# Patient Record
Sex: Female | Born: 1940 | Race: White | Hispanic: No | Marital: Married | Smoking: Never smoker
Health system: Southern US, Community
[De-identification: ages and names within clinical notes are randomized; demographics above are authoritative.]

## PROBLEM LIST (undated history)

## (undated) DIAGNOSIS — E039 Hypothyroidism, unspecified: Secondary | ICD-10-CM

## (undated) DIAGNOSIS — Z972 Presence of dental prosthetic device (complete) (partial): Secondary | ICD-10-CM

## (undated) DIAGNOSIS — I10 Essential (primary) hypertension: Secondary | ICD-10-CM

## (undated) DIAGNOSIS — H3530 Unspecified macular degeneration: Secondary | ICD-10-CM

## (undated) DIAGNOSIS — K5790 Diverticulosis of intestine, part unspecified, without perforation or abscess without bleeding: Secondary | ICD-10-CM

## (undated) DIAGNOSIS — E785 Hyperlipidemia, unspecified: Secondary | ICD-10-CM

## (undated) DIAGNOSIS — K219 Gastro-esophageal reflux disease without esophagitis: Secondary | ICD-10-CM

## (undated) HISTORY — PX: COLONOSCOPY: SHX174

## (undated) HISTORY — PX: ESOPHAGOGASTRODUODENOSCOPY: SHX1529

---

## 2004-05-09 ENCOUNTER — Ambulatory Visit: Payer: Self-pay | Admitting: Family Medicine

## 2005-07-23 ENCOUNTER — Ambulatory Visit: Payer: Self-pay | Admitting: Family Medicine

## 2006-07-22 ENCOUNTER — Ambulatory Visit: Payer: Self-pay | Admitting: Family Medicine

## 2007-08-01 ENCOUNTER — Ambulatory Visit: Payer: Self-pay | Admitting: Family Medicine

## 2007-08-14 ENCOUNTER — Ambulatory Visit: Payer: Self-pay | Admitting: Internal Medicine

## 2007-08-24 ENCOUNTER — Ambulatory Visit: Payer: Self-pay | Admitting: Family Medicine

## 2007-08-29 ENCOUNTER — Ambulatory Visit: Payer: Self-pay | Admitting: Family Medicine

## 2007-09-05 ENCOUNTER — Ambulatory Visit: Payer: Self-pay | Admitting: Internal Medicine

## 2007-09-11 ENCOUNTER — Ambulatory Visit: Payer: Self-pay | Admitting: Internal Medicine

## 2007-10-12 ENCOUNTER — Ambulatory Visit: Payer: Self-pay | Admitting: Internal Medicine

## 2008-07-19 IMAGING — CT CT CHEST-ABD-PELV W/ CM
1 of 2 series · 15 of 31 positions shown, 19 images · non-contrast
Comparison: none

REASON FOR EXAM: Shortness of breath, splenomegaly, staging of lymphoma
COMMENTS:

[Series 2: soft tissue · axial · 0.74mm/px · z∈[-592,-48]mm · 15 of 123 slices shown, 19 images]
[im 7/123  mediastinal]
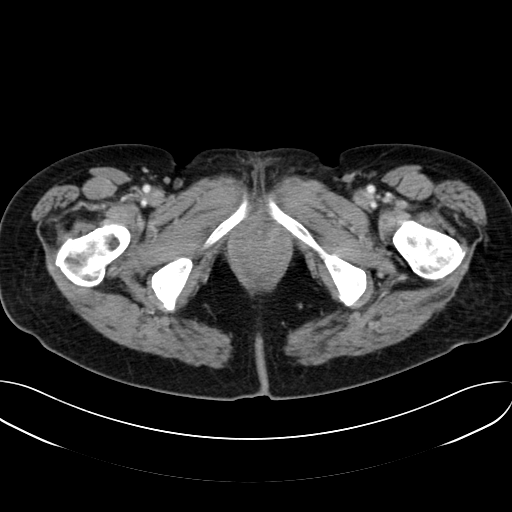
[im 7/123  bone]
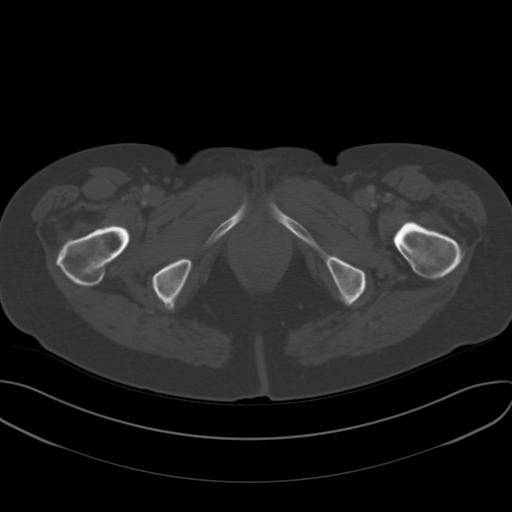
[im 20/123  mediastinal]
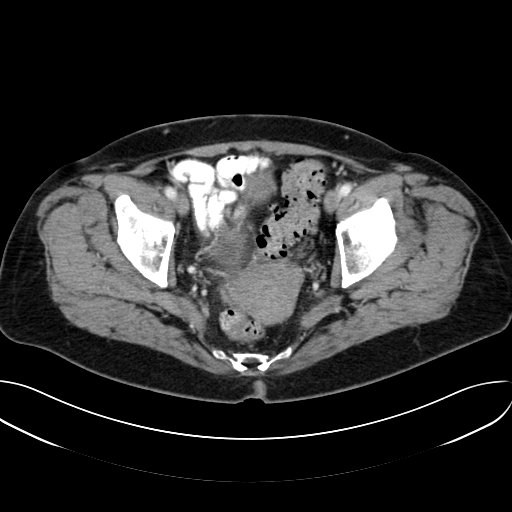
[im 33/123  mediastinal]
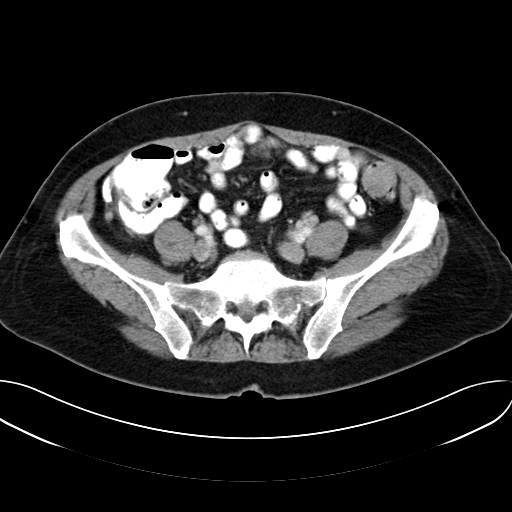
[im 39/123  mediastinal]
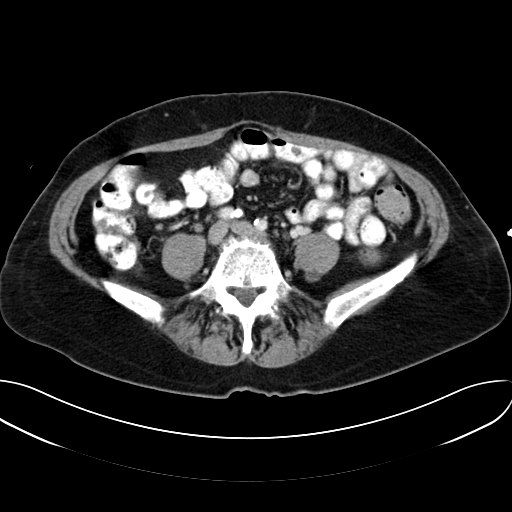
[im 45/123  mediastinal]
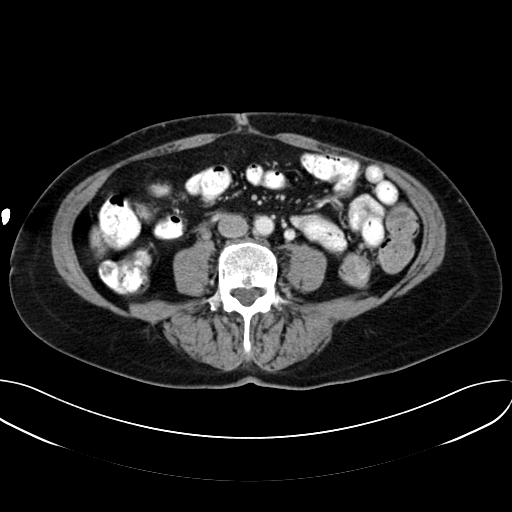
[im 52/123  mediastinal]
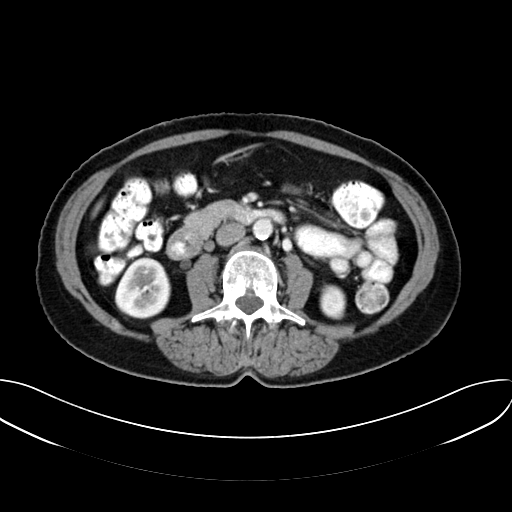
[im 60/123  mediastinal]
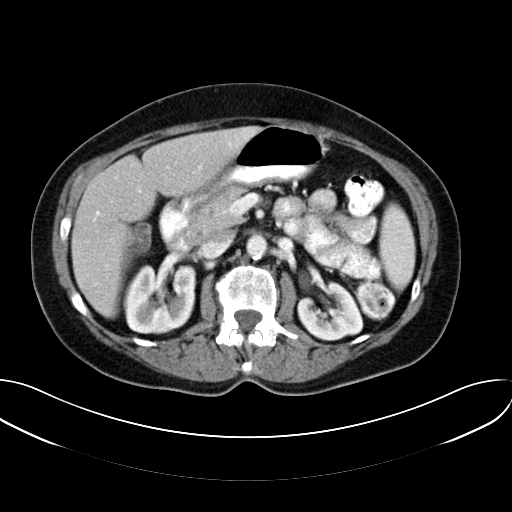
[im 71/123  mediastinal]
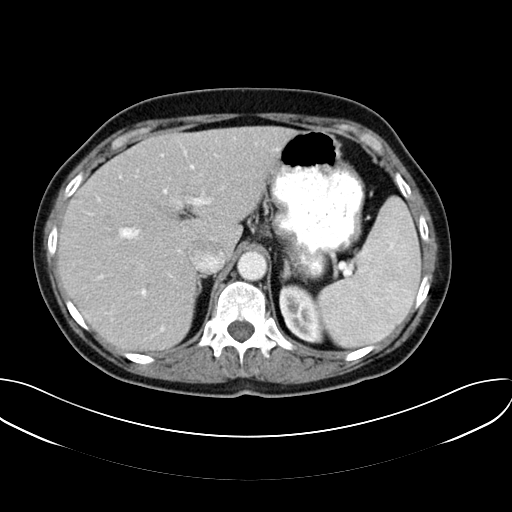
[im 78/123  mediastinal]
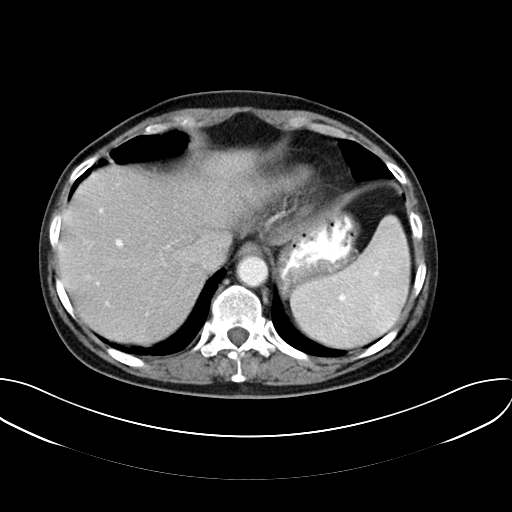
[im 78/123  bone]
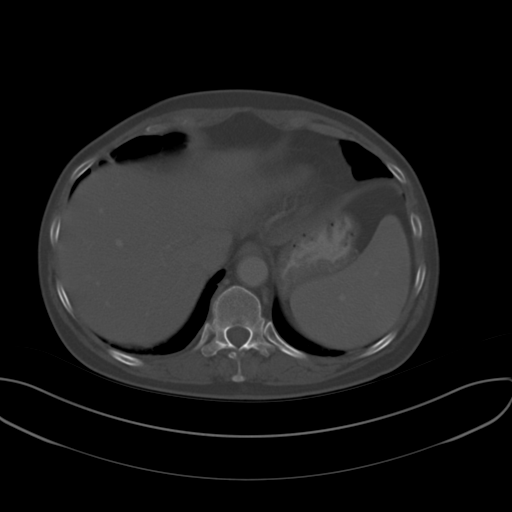
[im 84/123  mediastinal]
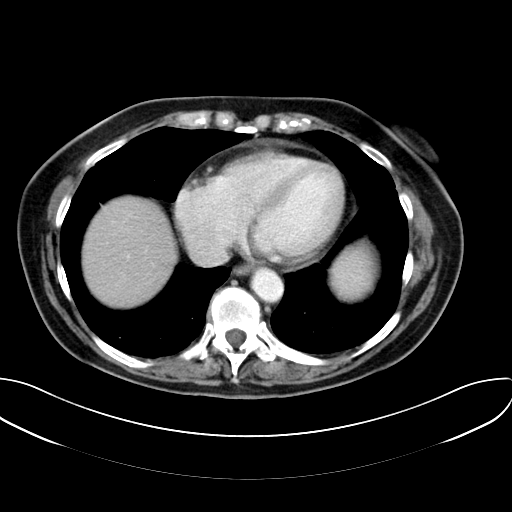
[im 90/123  mediastinal]
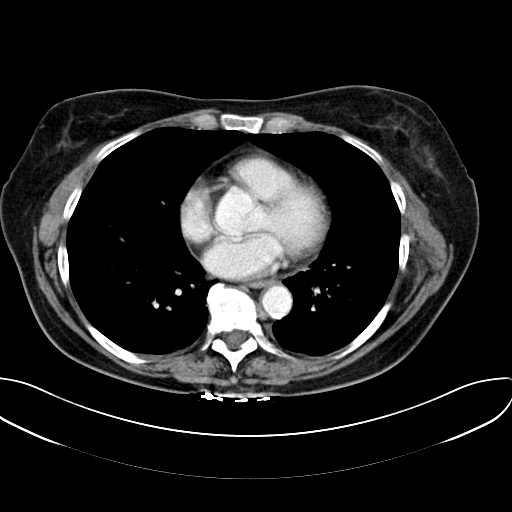
[im 97/123  lung]
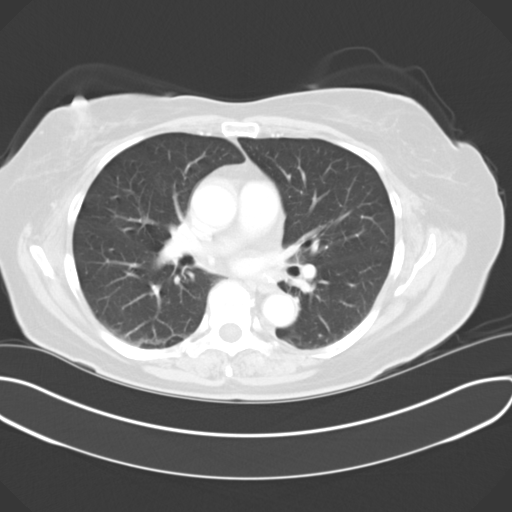
[im 103/123  mediastinal]
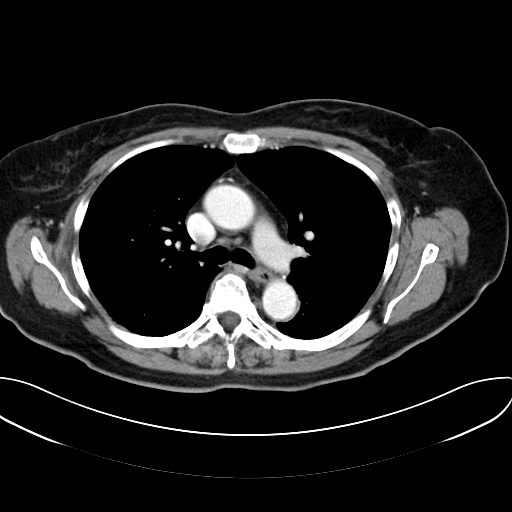
[im 103/123  lung]
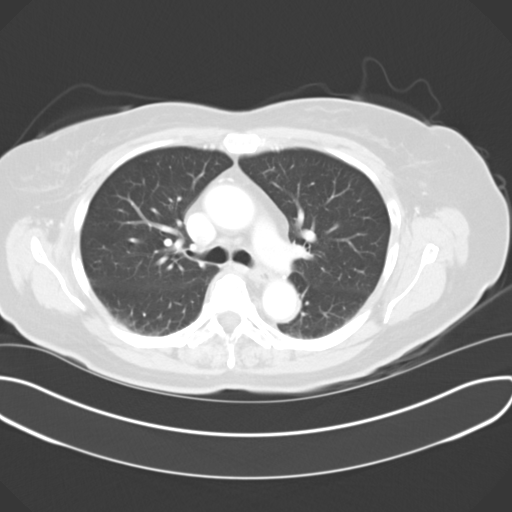
[im 110/123  lung]
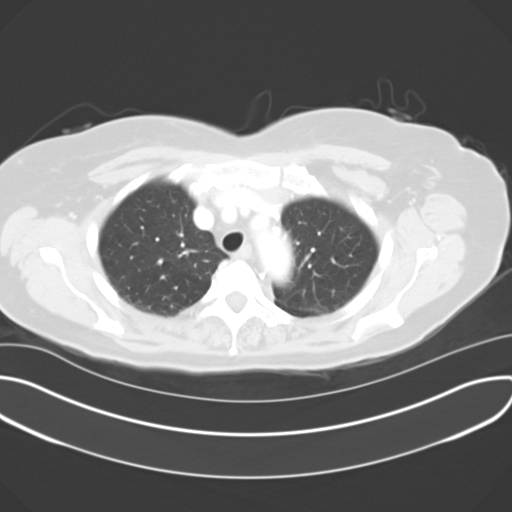
[im 116/123  mediastinal]
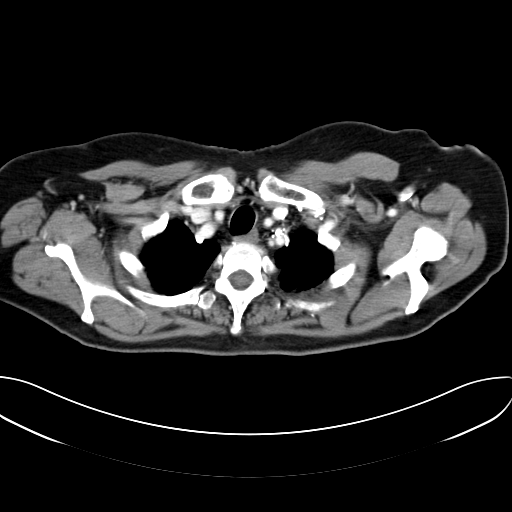
[im 116/123  lung]
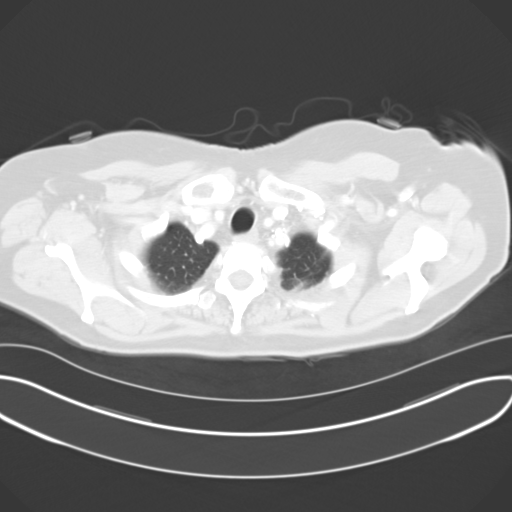

[15 of 31 positions shown; findings below may reference images not displayed]

PROCEDURE:     CT  - CT CHEST ABDOMEN AND PELVIS W  - September 26, 2007  [DATE]

RESULT:     Helical, 5.0 mm sections were obtained from the thoracic inlet
through the lung bases status post intravenous administration of 85 ml of
Msovue-IQU.

The study is compared to a previous study dated 08/29/2007.

Evaluation of the mediastinum and hilar regions and structures demonstrates
no evidence of mediastinal or hilar adenopathy or masses. The lung
parenchyma demonstrates no evidence of focal infiltrates, effusions or
edema, masses or nodules. There is no evidence of abdominal aortic aneurysm,
bowel obstruction or enteritis. There is evidence of diverticulosis within
the sigmoid colon without CT evidence of diverticulitis. There is no
evidence of pelvic loculated fluid collections or significant free fluid.
The spleen measures 12.3 cm in longitudinal dimension. There does not appear
to be further evidence of abdominal or pelvic free fluid, loculated fluid
collections, masses or adenopathy.
IMPRESSION: The spleen is within normal limits as described above
which, when compared to the previous study, has decreased in size. Otherwise
no further abdominal or pelvic abnormalities are appreciated.

## 2008-08-21 ENCOUNTER — Ambulatory Visit: Payer: Self-pay | Admitting: Family Medicine

## 2009-05-22 ENCOUNTER — Ambulatory Visit: Payer: Self-pay | Admitting: Unknown Physician Specialty

## 2009-08-26 ENCOUNTER — Ambulatory Visit: Payer: Self-pay | Admitting: Family Medicine

## 2009-09-02 ENCOUNTER — Ambulatory Visit: Payer: Self-pay | Admitting: Family Medicine

## 2009-09-13 ENCOUNTER — Ambulatory Visit: Payer: Self-pay | Admitting: Surgery

## 2009-09-13 ENCOUNTER — Ambulatory Visit: Payer: Self-pay | Admitting: Cardiovascular Disease

## 2009-09-19 ENCOUNTER — Ambulatory Visit: Payer: Self-pay | Admitting: Surgery

## 2009-09-29 HISTORY — PX: BREAST BIOPSY: SHX20

## 2010-08-28 ENCOUNTER — Ambulatory Visit: Payer: Self-pay | Admitting: Family Medicine

## 2011-09-01 ENCOUNTER — Ambulatory Visit: Payer: Self-pay | Admitting: Family Medicine

## 2012-06-23 ENCOUNTER — Encounter: Payer: Self-pay | Admitting: Otolaryngology

## 2012-07-13 ENCOUNTER — Encounter: Payer: Self-pay | Admitting: Otolaryngology

## 2012-08-13 ENCOUNTER — Encounter: Payer: Self-pay | Admitting: Otolaryngology

## 2012-09-13 ENCOUNTER — Ambulatory Visit: Payer: Self-pay | Admitting: Family Medicine

## 2013-09-14 ENCOUNTER — Ambulatory Visit: Payer: Self-pay | Admitting: Family Medicine

## 2014-07-16 ENCOUNTER — Ambulatory Visit: Payer: Self-pay | Admitting: Unknown Physician Specialty

## 2014-09-25 ENCOUNTER — Ambulatory Visit: Payer: Self-pay | Admitting: Family Medicine

## 2014-11-05 LAB — SURGICAL PATHOLOGY

## 2015-10-17 ENCOUNTER — Other Ambulatory Visit: Payer: Self-pay | Admitting: Nurse Practitioner

## 2015-10-17 DIAGNOSIS — Z1231 Encounter for screening mammogram for malignant neoplasm of breast: Secondary | ICD-10-CM

## 2015-10-21 ENCOUNTER — Ambulatory Visit
Admission: RE | Admit: 2015-10-21 | Discharge: 2015-10-21 | Disposition: A | Discharge status: Home / Self Care | Payer: Medicare Other | Source: Ambulatory Visit | Attending: Nurse Practitioner | Admitting: Nurse Practitioner

## 2015-10-21 DIAGNOSIS — Z1231 Encounter for screening mammogram for malignant neoplasm of breast: Secondary | ICD-10-CM | POA: Diagnosis not present

## 2016-09-18 ENCOUNTER — Other Ambulatory Visit: Payer: Self-pay | Admitting: Nurse Practitioner

## 2016-09-18 DIAGNOSIS — Z1231 Encounter for screening mammogram for malignant neoplasm of breast: Secondary | ICD-10-CM

## 2016-10-21 ENCOUNTER — Ambulatory Visit
Admission: RE | Admit: 2016-10-21 | Discharge: 2016-10-21 | Disposition: A | Discharge status: Home / Self Care | Payer: Medicare Other | Source: Ambulatory Visit | Attending: Nurse Practitioner | Admitting: Nurse Practitioner

## 2016-10-21 DIAGNOSIS — Z1231 Encounter for screening mammogram for malignant neoplasm of breast: Secondary | ICD-10-CM | POA: Diagnosis present

## 2018-04-25 ENCOUNTER — Other Ambulatory Visit: Payer: Self-pay | Admitting: Nurse Practitioner

## 2018-10-07 ENCOUNTER — Other Ambulatory Visit: Payer: Self-pay | Admitting: Nurse Practitioner

## 2018-10-07 DIAGNOSIS — Z1231 Encounter for screening mammogram for malignant neoplasm of breast: Secondary | ICD-10-CM

## 2019-09-21 ENCOUNTER — Other Ambulatory Visit
Admission: RE | Admit: 2019-09-21 | Discharge: 2019-09-21 | Disposition: A | Discharge status: Home / Self Care | Payer: Medicare Other | Source: Ambulatory Visit | Attending: Internal Medicine | Admitting: Internal Medicine

## 2019-09-21 ENCOUNTER — Other Ambulatory Visit: Payer: Self-pay

## 2019-09-21 DIAGNOSIS — Z01812 Encounter for preprocedural laboratory examination: Secondary | ICD-10-CM | POA: Insufficient documentation

## 2019-09-21 DIAGNOSIS — Z20822 Contact with and (suspected) exposure to covid-19: Secondary | ICD-10-CM | POA: Insufficient documentation

## 2019-09-21 LAB — SARS CORONAVIRUS 2 (TAT 6-24 HRS): SARS Coronavirus 2: NEGATIVE

## 2019-09-22 ENCOUNTER — Encounter: Payer: Self-pay | Admitting: Internal Medicine

## 2019-09-25 ENCOUNTER — Encounter: Payer: Self-pay | Admitting: Internal Medicine

## 2019-09-25 ENCOUNTER — Ambulatory Visit: Payer: Medicare Other | Admitting: Anesthesiology

## 2019-09-25 ENCOUNTER — Ambulatory Visit
Admission: RE | Admit: 2019-09-25 | Discharge: 2019-09-25 | Disposition: A | Discharge status: Home / Self Care | Payer: Medicare Other | Attending: Internal Medicine | Admitting: Internal Medicine

## 2019-09-25 ENCOUNTER — Other Ambulatory Visit: Payer: Self-pay

## 2019-09-25 ENCOUNTER — Encounter
Admission: RE | Disposition: A | Discharge status: Home / Self Care | Payer: Self-pay | Source: Home / Self Care | Attending: Internal Medicine

## 2019-09-25 DIAGNOSIS — K5730 Diverticulosis of large intestine without perforation or abscess without bleeding: Secondary | ICD-10-CM | POA: Insufficient documentation

## 2019-09-25 DIAGNOSIS — H3530 Unspecified macular degeneration: Secondary | ICD-10-CM | POA: Diagnosis not present

## 2019-09-25 DIAGNOSIS — Z881 Allergy status to other antibiotic agents status: Secondary | ICD-10-CM | POA: Insufficient documentation

## 2019-09-25 DIAGNOSIS — Z79899 Other long term (current) drug therapy: Secondary | ICD-10-CM | POA: Diagnosis not present

## 2019-09-25 DIAGNOSIS — Z1211 Encounter for screening for malignant neoplasm of colon: Secondary | ICD-10-CM | POA: Diagnosis not present

## 2019-09-25 DIAGNOSIS — Z86010 Personal history of colonic polyps: Secondary | ICD-10-CM | POA: Insufficient documentation

## 2019-09-25 DIAGNOSIS — E039 Hypothyroidism, unspecified: Secondary | ICD-10-CM | POA: Diagnosis not present

## 2019-09-25 DIAGNOSIS — Z885 Allergy status to narcotic agent status: Secondary | ICD-10-CM | POA: Insufficient documentation

## 2019-09-25 DIAGNOSIS — Z886 Allergy status to analgesic agent status: Secondary | ICD-10-CM | POA: Diagnosis not present

## 2019-09-25 DIAGNOSIS — Z79890 Hormone replacement therapy (postmenopausal): Secondary | ICD-10-CM | POA: Diagnosis not present

## 2019-09-25 DIAGNOSIS — I10 Essential (primary) hypertension: Secondary | ICD-10-CM | POA: Insufficient documentation

## 2019-09-25 HISTORY — DX: Gastro-esophageal reflux disease without esophagitis: K21.9

## 2019-09-25 HISTORY — DX: Hyperlipidemia, unspecified: E78.5

## 2019-09-25 HISTORY — DX: Diverticulosis of intestine, part unspecified, without perforation or abscess without bleeding: K57.90

## 2019-09-25 HISTORY — DX: Essential (primary) hypertension: I10

## 2019-09-25 HISTORY — DX: Unspecified macular degeneration: H35.30

## 2019-09-25 HISTORY — DX: Hypothyroidism, unspecified: E03.9

## 2019-09-25 HISTORY — PX: COLONOSCOPY WITH PROPOFOL: SHX5780

## 2019-09-25 SURGERY — COLONOSCOPY WITH PROPOFOL
Anesthesia: General

## 2019-09-25 MED ORDER — PROPOFOL 500 MG/50ML IV EMUL
INTRAVENOUS | Status: DC | PRN
  Administered 2019-09-25: 90 mg via INTRAVENOUS

## 2019-09-25 MED ORDER — LIDOCAINE HCL (CARDIAC) PF 100 MG/5ML IV SOSY
PREFILLED_SYRINGE | INTRAVENOUS | Status: DC | PRN
  Administered 2019-09-25: 40 mg via INTRAVENOUS

## 2019-09-25 MED ORDER — SODIUM CHLORIDE 0.9 % IV SOLN: INTRAVENOUS | Status: DC

## 2019-09-25 MED ORDER — PROPOFOL 500 MG/50ML IV EMUL
INTRAVENOUS | Status: AC
  Filled 2019-09-25: qty 50

## 2019-09-25 MED ORDER — GLYCOPYRROLATE 0.2 MG/ML IJ SOLN
INTRAMUSCULAR | Status: DC | PRN
  Administered 2019-09-25: .2 mg via INTRAVENOUS

## 2019-09-25 MED ORDER — LIDOCAINE HCL (PF) 2 % IJ SOLN
INTRAMUSCULAR | Status: DC | PRN
  Administered 2019-09-25: 40 mg via INTRADERMAL

## 2019-09-25 NOTE — Transfer of Care (Signed)
Immediate Anesthesia Transfer of Care Note  Patient: Sarah Paul  Procedure(s) Performed: COLONOSCOPY WITH PROPOFOL (N/A )  Patient Location: PACU  Anesthesia Type:MAC  Level of Consciousness: drowsy  Airway & Oxygen Therapy: Patient Spontanous Breathing  Post-op Assessment: Report given to RN  Post vital signs: stable  Last Vitals:  Vitals Value Taken Time  BP 120/64 09/25/19 0934  Temp 36.1 C 09/25/19 0933  Pulse 71 09/25/19 0935  Resp 11 09/25/19 0935  SpO2 96 % 09/25/19 0935  Vitals shown include unvalidated device data.  Last Pain:  Vitals:   09/25/19 0933  TempSrc: Temporal  PainSc: Asleep         Complications: No apparent anesthesia complications

## 2019-09-25 NOTE — Op Note (Signed)
[facility] Gastroenterology Patient Name: Sarah Paul Procedure Date: 09/25/2019 9:09 AM MRN: [id number] Account #: [id number] Date of Birth: August 18, 1940 Admit Type: Outpatient Age: 79 Room: [facility] ENDO ROOM 3 Gender: Female Note Status: Finalized Procedure:             Colonoscopy Indications:           High risk colon cancer surveillance: Personal history                         of colonic polyps Providers:             Boykin Nearing. Norma Fredrickson MD, MD Referring MD:          Caryl Asp (Referring MD) Medicines:             Propofol per Anesthesia Complications:         No immediate complications. Procedure:             Pre-Anesthesia Assessment:                        - The risks and benefits of the procedure and the                         sedation options and risks were discussed with the                         patient. All questions were answered and informed                         consent was obtained.                        - Patient identification and proposed procedure were                         verified prior to the procedure by the nurse. The                         procedure was verified in the procedure room.                        - ASA Grade Assessment: III - A patient with severe                         systemic disease.                        - After reviewing the risks and benefits, the patient                         was deemed in satisfactory condition to undergo the                         procedure.                        After obtaining informed consent, the colonoscope was                         passed under direct vision. Throughout the procedure,  the patient's blood pressure, pulse, and oxygen                         saturations were monitored continuously. The                         Colonoscope was introduced through the anus and                         advanced to the the cecum, identified by appendiceal                        orifice and ileocecal valve. The patient tolerated the                         procedure well. The colonoscopy was somewhat difficult                         due to a redundant colon and significant looping.                         Successful completion of the procedure was aided by                         applying abdominal pressure. The patient tolerated the                         procedure well. The quality of the bowel preparation                         was good. The ileocecal valve, appendiceal orifice,                         and rectum were photographed. Findings:      The perianal and digital rectal examinations were normal. Pertinent       negatives include normal sphincter tone and no palpable rectal lesions.      Multiple small and large-mouthed diverticula were found in the entire       colon. There was no evidence of diverticular bleeding.      The exam was otherwise without abnormality on direct and retroflexion       views. Impression:            - Moderate diverticulosis in the entire examined                         colon. There was no evidence of diverticular bleeding.                        - The examination was otherwise normal on direct and                         retroflexion views.                        - No specimens collected. Recommendation:        - Patient has a contact number available for  emergencies. The signs and symptoms of potential                         delayed complications were discussed with the patient.                         Return to normal activities tomorrow. Written                         discharge instructions were provided to the patient.                        - Resume previous diet.                        - Continue present medications.                        - No repeat colonoscopy due to current age (21 years                         or older) and the absence of colonic polyps.                         - Return to GI office PRN.                        - The findings and recommendations were discussed with                         the patient.                        - You do NOT require further colon cancer screening                         measures (Annual stool testing (i.e. hemoccult, FIT,                         cologuard), sigmoidoscopy, colonoscopy or CT                         colonography). You should share this recommendation                         with your Primary Care provider. Procedure Code(s):     --- Professional ---                        K7425, Colorectal cancer screening; colonoscopy on                         individual at high risk Diagnosis Code(s):     --- Professional ---                        K57.30, Diverticulosis of large intestine without                         perforation or abscess without bleeding  Z86.010, Personal history of colonic polyps CPT copyright 2019 American Medical Association. All rights reserved. The codes documented in this report are preliminary and upon coder review may  be revised to meet current compliance requirements. Efrain Sella MD, MD 09/25/2019 9:46:05 AM This report has been signed electronically. Number of Addenda: 0 Note Initiated On: 09/25/2019 9:09 AM Scope Withdrawal Time: 0 hours 6 minutes 11 seconds  Total Procedure Duration: 0 hours 14 minutes 25 seconds  Estimated Blood Loss:  Estimated blood loss: none.      [facility]

## 2019-09-25 NOTE — Anesthesia Preprocedure Evaluation (Signed)
Anesthesia Evaluation  Patient identified by MRN, date of birth, ID band Patient awake    Reviewed: Allergy & Precautions, NPO status , Patient's Chart, lab work & pertinent test results  History of Anesthesia Complications Negative for: history of anesthetic complications  Airway Mallampati: II  TM Distance: >3 FB Neck ROM: Full    Dental  (+) Poor Dentition   Pulmonary neg pulmonary ROS, neg sleep apnea, neg COPD,    breath sounds clear to auscultation- rhonchi (-) wheezing      Cardiovascular hypertension, Pt. on medications (-) CAD, (-) Past MI, (-) Cardiac Stents and (-) CABG  Rhythm:Regular Rate:Normal - Systolic murmurs and - Diastolic murmurs    Neuro/Psych neg Seizures negative neurological ROS  negative psych ROS   GI/Hepatic Neg liver ROS, GERD  ,  Endo/Other  neg diabetesHypothyroidism   Renal/GU negative Renal ROS     Musculoskeletal negative musculoskeletal ROS (+)   Abdominal (+) - obese,   Peds  Hematology negative hematology ROS (+)   Anesthesia Other Findings Past Medical History: No date: Diverticulosis No date: GERD (gastroesophageal reflux disease) No date: Hyperlipidemia No date: Hypertension No date: Hypothyroidism No date: Macular degeneration   Reproductive/Obstetrics                             Anesthesia Physical Anesthesia Plan  ASA: II  Anesthesia Plan: General   Post-op Pain Management:    Induction: Intravenous  PONV Risk Score and Plan: 2 and Propofol infusion  Airway Management Planned: Natural Airway  Additional Equipment:   Intra-op Plan:   Post-operative Plan:   Informed Consent: I have reviewed the patients History and Physical, chart, labs and discussed the procedure including the risks, benefits and alternatives for the proposed anesthesia with the patient or authorized representative who has indicated his/her understanding and  acceptance.     Dental advisory given  Plan Discussed with: CRNA and Anesthesiologist  Anesthesia Plan Comments:         Anesthesia Quick Evaluation

## 2019-09-25 NOTE — H&P (Signed)
Outpatient short stay form Pre-procedure 09/25/2019 9:12 AM Sarah Paul K. Sarah Paul, M.D.  Primary Physician: Lenon Oms, NP  Reason for visit: Personal hx of colon polyps  History of present illness:                            Patient presents for colonoscopy for a personal hx of colon polyps. The patient denies abdominal pain, abnormal weight loss or rectal bleeding.      Current Facility-Administered Medications:  .  0.9 %  sodium chloride infusion, , Intravenous, Continuous, [place], Boykin Nearing, MD, Last Rate: 20 mL/hr at 09/25/19 0841, New Bag at 09/25/19 0841  Medications Prior to Admission  Medication Sig Dispense Refill Last Dose  . Biotin 1 MG CAPS Take 1 tablet by mouth daily.   Past Week at Unknown time  . CHOLECALCIFEROL PO Take 1,000 Units by mouth daily.   Past Week at Unknown time  . Coenzyme Q10 (COQ-10 PO) Take by mouth daily.   Past Week at Unknown time  . levothyroxine (SYNTHROID) 75 MCG tablet Take 75 mcg by mouth daily before breakfast.   09/23/2019  . Multiple Vitamins-Minerals (PRESERVISION AREDS 2+MULTI VIT) CAPS Take by mouth daily.   Past Week at Unknown time  . TURMERIC PO Take by mouth daily.   Past Week at Unknown time  . VITAMIN A PO Take by mouth daily.   Past Week at Unknown time     Allergies  Allergen Reactions  . Aspirin Other (See Comments)    Causes Nosebleeds  . Ciprofloxacin Rash  . Codeine Nausea And Vomiting     Past Medical History:  Diagnosis Date  . Diverticulosis   . GERD (gastroesophageal reflux disease)   . Hyperlipidemia   . Hypertension   . Hypothyroidism   . Macular degeneration     Review of systems:  Otherwise negative.    Physical Exam  Gen: Alert, oriented. Appears stated age.  HEENT: [place]/AT. PERRLA. Lungs: CTA, no wheezes. CV: RR nl S1, S2. Abd: soft, benign, no masses. BS+ Ext: No edema. Pulses 2+    Planned procedures: Proceed with colonoscopy. The patient understands the nature of the planned procedure,  indications, risks, alternatives and potential complications including but not limited to bleeding, infection, perforation, damage to internal organs and possible oversedation/side effects from anesthesia. The patient agrees and gives consent to proceed.  Please refer to procedure notes for findings, recommendations and patient disposition/instructions.     Sarah Paul K. Sarah Paul, M.D. Gastroenterology 09/25/2019  9:12 AM

## 2019-09-25 NOTE — Interval H&P Note (Signed)
History and Physical Interval Note:  09/25/2019 9:12 AM  Sarah Paul  has presented today for surgery, with the diagnosis of phx ta polyps.  The various methods of treatment have been discussed with the patient and family. After consideration of risks, benefits and other options for treatment, the patient has consented to  Procedure(s): COLONOSCOPY WITH PROPOFOL (N/A) as a surgical intervention.  The patient's history has been reviewed, patient examined, no change in status, stable for surgery.  I have reviewed the patient's chart and labs.  Questions were answered to the patient's satisfaction.     [place], [place]

## 2019-09-25 NOTE — Anesthesia Postprocedure Evaluation (Signed)
Anesthesia Post Note  Patient: Kataleena Holsapple Decou  Procedure(s) Performed: COLONOSCOPY WITH PROPOFOL (N/A )  Patient location during evaluation: Endoscopy Anesthesia Type: General Level of consciousness: awake and alert and oriented Pain management: pain level controlled Vital Signs Assessment: post-procedure vital signs reviewed and stable Respiratory status: spontaneous breathing, nonlabored ventilation and respiratory function stable Cardiovascular status: blood pressure returned to baseline and stable Postop Assessment: no signs of nausea or vomiting Anesthetic complications: no     Last Vitals:  Vitals:   09/25/19 0933 09/25/19 1003  BP: 120/64 (!) 152/70  Pulse: 64   Resp: 11 15  Temp: (!) 36.1 C   SpO2: 96%     Last Pain:  Vitals:   09/25/19 1003  TempSrc:   PainSc: 0-No pain                 Juaquin Ludington

## 2019-09-26 ENCOUNTER — Encounter: Payer: Self-pay | Admitting: *Deleted

## 2019-10-31 ENCOUNTER — Other Ambulatory Visit: Payer: Self-pay

## 2019-10-31 ENCOUNTER — Ambulatory Visit
Admission: RE | Admit: 2019-10-31 | Discharge: 2019-10-31 | Disposition: A | Discharge status: Home / Self Care | Payer: Medicare Other | Source: Ambulatory Visit | Attending: Nurse Practitioner | Admitting: Nurse Practitioner

## 2019-10-31 DIAGNOSIS — Z1231 Encounter for screening mammogram for malignant neoplasm of breast: Secondary | ICD-10-CM | POA: Diagnosis not present

## 2020-08-23 IMAGING — MG DIGITAL SCREENING BILAT W/ TOMO W/ CAD
8 series · 8 of 24 positions shown · non-contrast
Comparison: Previous exam(s).

CLINICAL DATA: Screening.

EXAM:
DIGITAL SCREENING BILATERAL MAMMOGRAM WITH TOMO AND CAD

[R CC synth-2D]
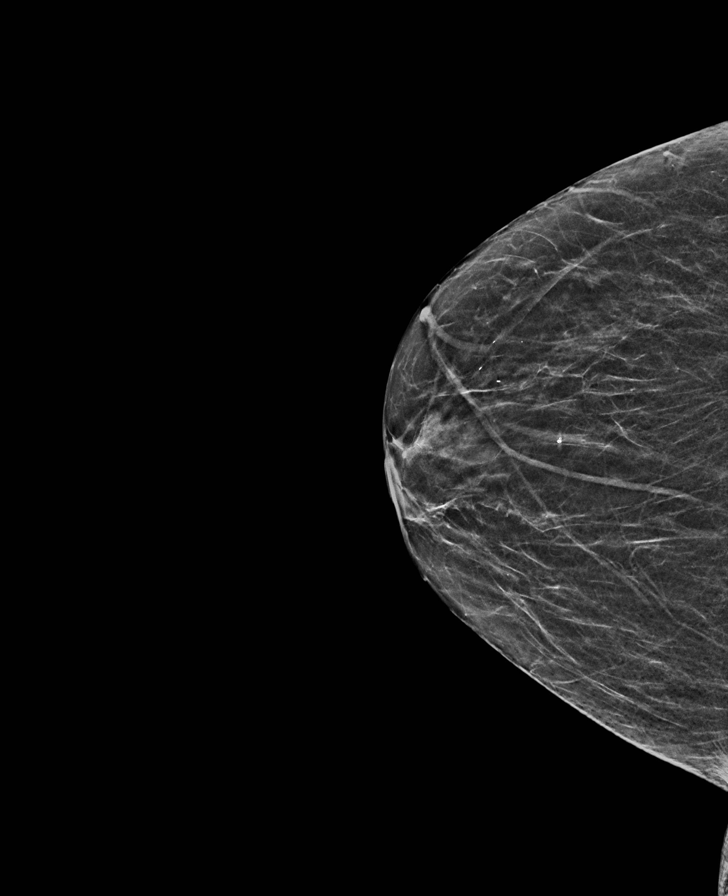

[L CC synth-2D]
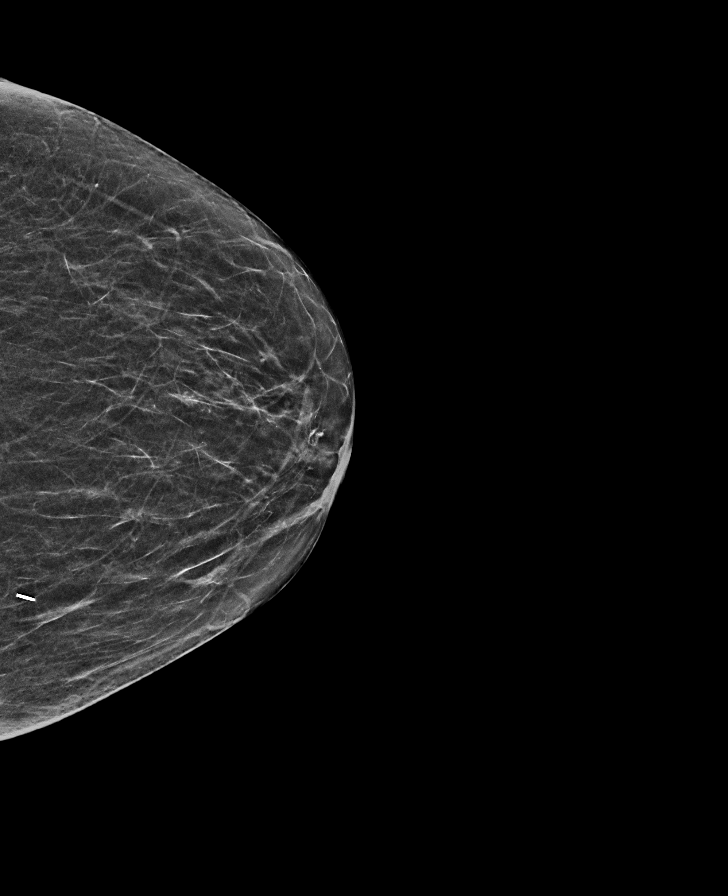

[R MLO synth-2D]
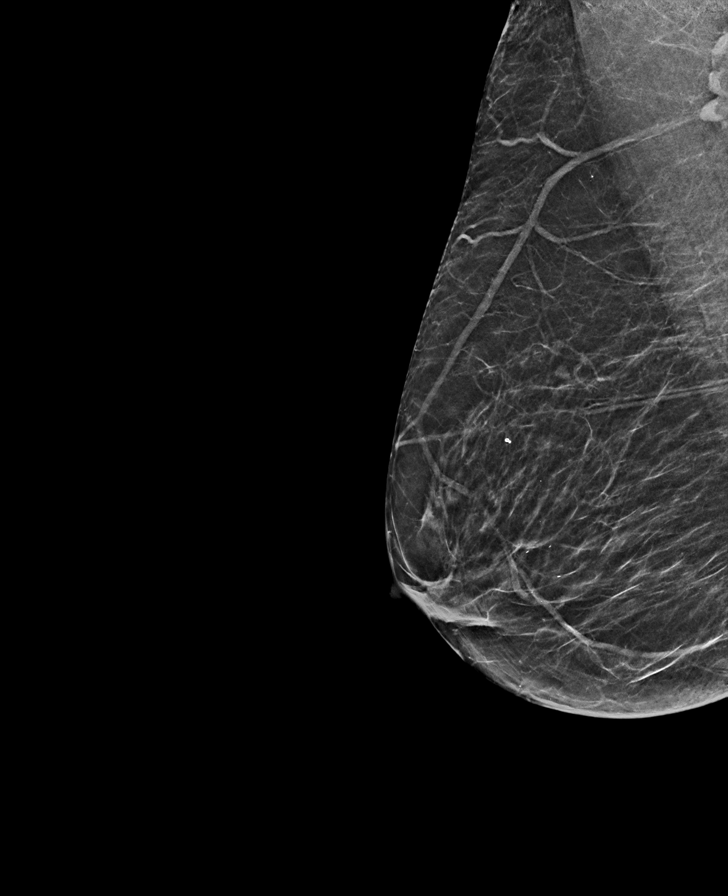

[L MLO synth-2D]
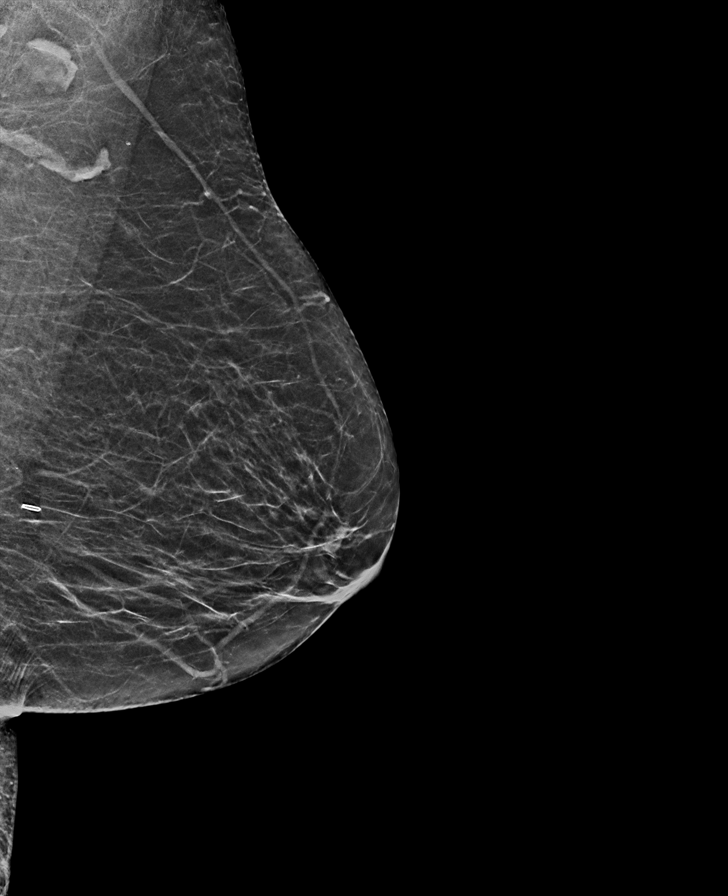

[R CC tomo · tomo slice 23/44.0]
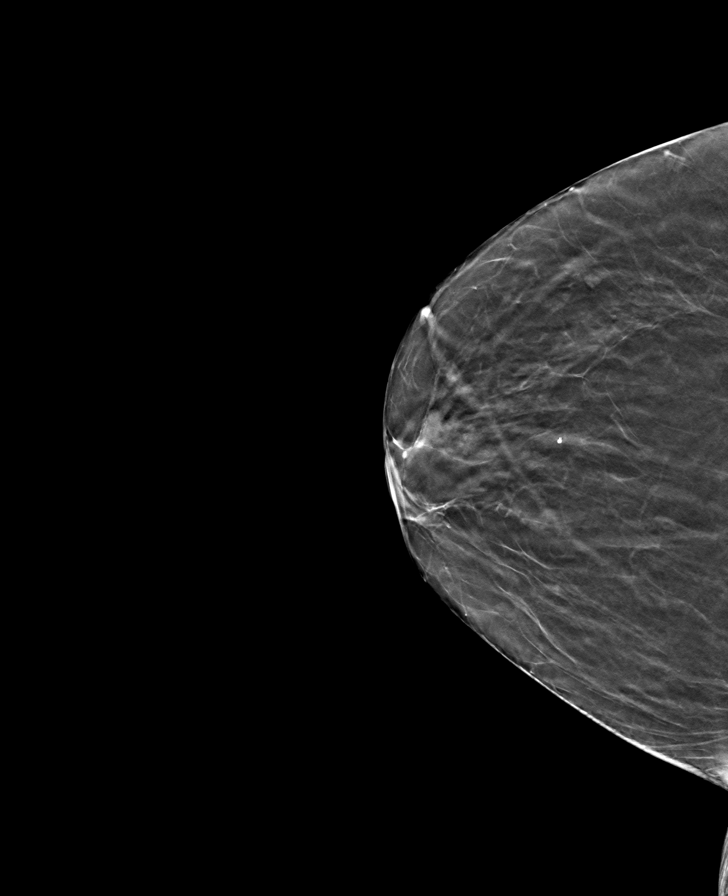

[L MLO tomo · tomo slice 26/51.0]
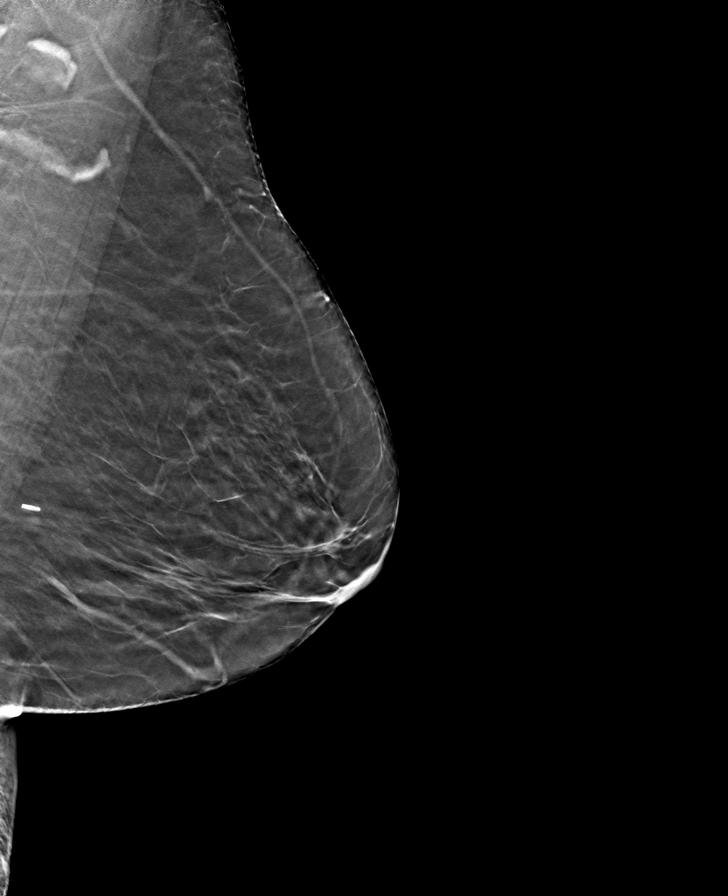

[R MLO tomo · tomo slice 25/48.0]
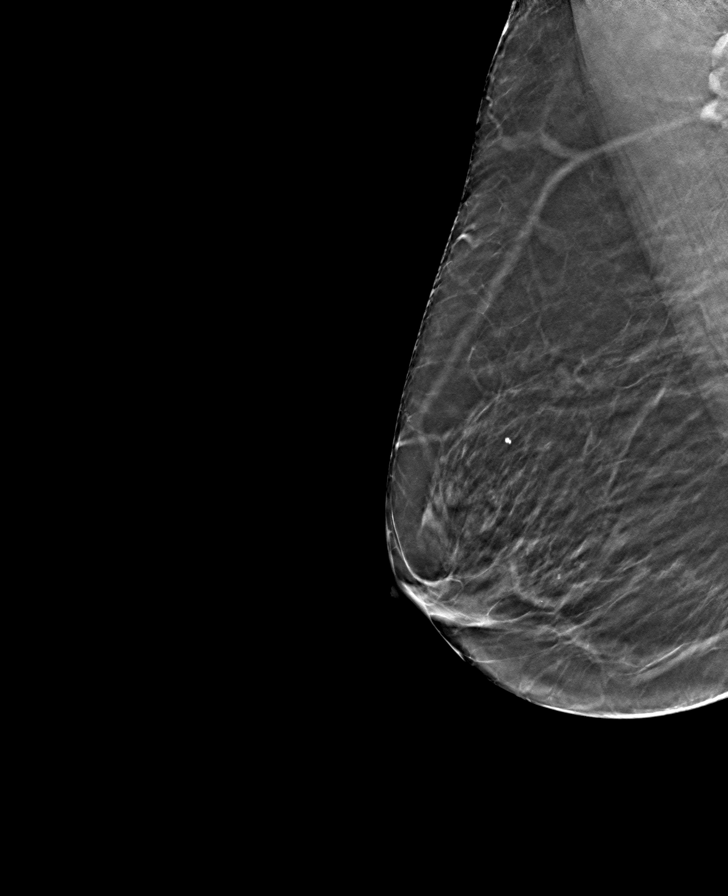

[L CC tomo · tomo slice 23/45.0]
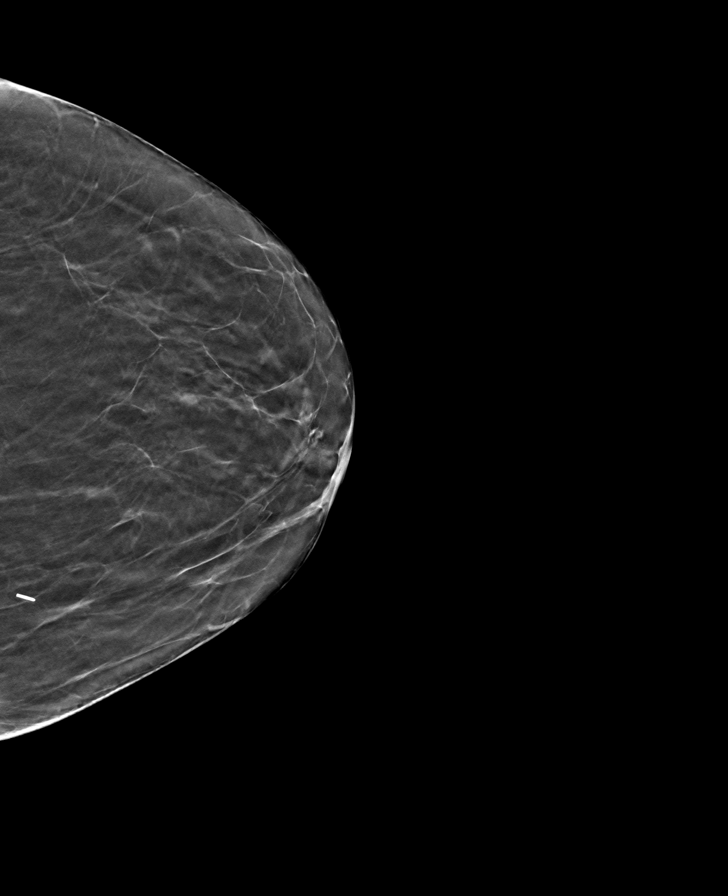

[8 of 24 positions shown; findings below may reference images not displayed]

ACR Breast Density Category b: There are scattered areas of
fibroglandular density.
FINDINGS: There are no findings suspicious for malignancy. Images were
processed with CAD.
IMPRESSION: No mammographic evidence of malignancy. A result letter of this
screening mammogram will be mailed directly to the patient.

RECOMMENDATION:
Screening mammogram in one year. (Code:[id number])

BI-RADS CATEGORY  1: Negative.

## 2020-10-21 ENCOUNTER — Other Ambulatory Visit: Payer: Self-pay

## 2020-10-21 ENCOUNTER — Encounter: Payer: Self-pay | Admitting: Ophthalmology

## 2020-10-24 NOTE — Discharge Instructions (Signed)

## 2020-10-28 ENCOUNTER — Other Ambulatory Visit: Payer: Self-pay

## 2020-10-28 ENCOUNTER — Encounter
Admission: RE | Disposition: A | Discharge status: Home / Self Care | Payer: Self-pay | Source: Home / Self Care | Attending: Ophthalmology

## 2020-10-28 ENCOUNTER — Encounter: Payer: Self-pay | Admitting: Ophthalmology

## 2020-10-28 ENCOUNTER — Ambulatory Visit
Admission: RE | Admit: 2020-10-28 | Discharge: 2020-10-28 | Disposition: A | Discharge status: Home / Self Care | Payer: Medicare Other | Attending: Ophthalmology | Admitting: Ophthalmology

## 2020-10-28 ENCOUNTER — Ambulatory Visit: Payer: Medicare Other | Admitting: Anesthesiology

## 2020-10-28 DIAGNOSIS — Z79890 Hormone replacement therapy (postmenopausal): Secondary | ICD-10-CM | POA: Diagnosis not present

## 2020-10-28 DIAGNOSIS — H2511 Age-related nuclear cataract, right eye: Secondary | ICD-10-CM | POA: Insufficient documentation

## 2020-10-28 DIAGNOSIS — Z885 Allergy status to narcotic agent status: Secondary | ICD-10-CM | POA: Insufficient documentation

## 2020-10-28 DIAGNOSIS — E039 Hypothyroidism, unspecified: Secondary | ICD-10-CM | POA: Insufficient documentation

## 2020-10-28 DIAGNOSIS — Z886 Allergy status to analgesic agent status: Secondary | ICD-10-CM | POA: Diagnosis not present

## 2020-10-28 DIAGNOSIS — E785 Hyperlipidemia, unspecified: Secondary | ICD-10-CM | POA: Insufficient documentation

## 2020-10-28 DIAGNOSIS — Z881 Allergy status to other antibiotic agents status: Secondary | ICD-10-CM | POA: Diagnosis not present

## 2020-10-28 DIAGNOSIS — I10 Essential (primary) hypertension: Secondary | ICD-10-CM | POA: Insufficient documentation

## 2020-10-28 HISTORY — DX: Presence of dental prosthetic device (complete) (partial): Z97.2

## 2020-10-28 HISTORY — PX: CATARACT EXTRACTION W/PHACO: SHX586

## 2020-10-28 SURGERY — PHACOEMULSIFICATION, CATARACT, WITH IOL INSERTION
Anesthesia: Monitor Anesthesia Care | Site: Eye | Laterality: Right

## 2020-10-28 MED ORDER — TETRACAINE HCL 0.5 % OP SOLN
1.0000 [drp] | OPHTHALMIC | Status: DC | PRN
  Administered 2020-10-28 (×3): 1 [drp] via OPHTHALMIC

## 2020-10-28 MED ORDER — LACTATED RINGERS IV SOLN: INTRAVENOUS | Status: DC

## 2020-10-28 MED ORDER — POLYMYXIN B-TRIMETHOPRIM 10000-0.1 UNIT/ML-% OP SOLN
OPHTHALMIC | Status: DC | PRN
  Administered 2020-10-28: 2 [drp] via OPHTHALMIC

## 2020-10-28 MED ORDER — SODIUM HYALURONATE 23 MG/ML IO SOLN
INTRAOCULAR | Status: DC | PRN
  Administered 2020-10-28: 0.6 mL via INTRAOCULAR

## 2020-10-28 MED ORDER — FENTANYL CITRATE (PF) 100 MCG/2ML IJ SOLN
INTRAMUSCULAR | Status: DC | PRN
  Administered 2020-10-28: 50 ug via INTRAVENOUS

## 2020-10-28 MED ORDER — ARMC OPHTHALMIC DILATING DROPS
1.0000 "application " | OPHTHALMIC | Status: DC | PRN
  Administered 2020-10-28 (×3): 1 via OPHTHALMIC

## 2020-10-28 MED ORDER — LIDOCAINE HCL (PF) 2 % IJ SOLN
INTRAOCULAR | Status: DC | PRN
  Administered 2020-10-28: 1 mL via INTRAOCULAR
  Administered 2020-10-28: 75 mL via INTRAOCULAR

## 2020-10-28 MED ORDER — MIDAZOLAM HCL 2 MG/2ML IJ SOLN
INTRAMUSCULAR | Status: DC | PRN
  Administered 2020-10-28: 1 mg via INTRAVENOUS

## 2020-10-28 MED ORDER — EPINEPHRINE PF 1 MG/ML IJ SOLN: INTRAOCULAR | Status: DC | PRN

## 2020-10-28 MED ORDER — SODIUM HYALURONATE 10 MG/ML IO SOLN
INTRAOCULAR | Status: DC | PRN
  Administered 2020-10-28: 0.55 mL via INTRAOCULAR

## 2020-10-28 SURGICAL SUPPLY — 17 items
CANNULA ANT/CHMB 27GA (MISCELLANEOUS) ×4 IMPLANT
DISSECTOR HYDRO NUCLEUS 50X22 (MISCELLANEOUS) ×2 IMPLANT
GLOVE PI ULTRA LF STRL 7.5 (GLOVE) ×1 IMPLANT
GLOVE PI ULTRA NON LATEX 7.5 (GLOVE) ×1
GLOVE SURG SYN 8.5  E (GLOVE) ×2
GLOVE SURG SYN 8.5 E (GLOVE) ×2 IMPLANT
GOWN STRL REUS W/ TWL LRG LVL3 (GOWN DISPOSABLE) ×2 IMPLANT
GOWN STRL REUS W/TWL LRG LVL3 (GOWN DISPOSABLE) ×4
LENS IOL TECNIS EYHANCE 30.5 (Intraocular Lens) ×2 IMPLANT
MARKER SKIN DUAL TIP RULER LAB (MISCELLANEOUS) ×2 IMPLANT
PACK DR. KING ARMS (PACKS) ×2 IMPLANT
PACK EYE AFTER SURG (MISCELLANEOUS) ×2 IMPLANT
PACK OPTHALMIC (MISCELLANEOUS) ×2 IMPLANT
SYR 3ML LL SCALE MARK (SYRINGE) ×2 IMPLANT
SYR TB 1ML LUER SLIP (SYRINGE) ×2 IMPLANT
WATER STERILE IRR 250ML POUR (IV SOLUTION) ×2 IMPLANT
WIPE NON LINTING 3.25X3.25 (MISCELLANEOUS) ×2 IMPLANT

## 2020-10-28 NOTE — H&P (Signed)
[facility]   Primary Care Physician:  Myrene Buddy, NP Ophthalmologist: Dr. Willey Blade  Pre-Procedure History & Physical: HPI:  Chauntae Hults is a 80 y.o. female here for cataract surgery.   Past Medical History:  Diagnosis Date  . Diverticulosis   . GERD (gastroesophageal reflux disease)   . Hyperlipidemia   . Hypertension   . Hypothyroidism   . Macular degeneration   . Wears dentures    partial lower    Past Surgical History:  Procedure Laterality Date  . BREAST BIOPSY Left 09/29/2009   [place] bx-neg  . COLONOSCOPY    . COLONOSCOPY WITH PROPOFOL N/A 09/25/2019   Procedure: COLONOSCOPY WITH PROPOFOL;  Surgeon: [place], Boykin Nearing, MD;  Location: [facility] ENDOSCOPY;  Service: Gastroenterology;  Laterality: N/A;  . ESOPHAGOGASTRODUODENOSCOPY      Prior to Admission medications   Medication Sig Start Date End Date Taking? Authorizing Provider  acetaminophen (TYLENOL) 500 MG tablet Take 500 mg by mouth every 6 (six) hours as needed.   Yes [provider]  Biotin 1 MG CAPS Take 1 tablet by mouth daily.   Yes [provider]  CHOLECALCIFEROL PO Take 1,000 Units by mouth daily.   Yes [provider]  Coenzyme Q10 (COQ-10 PO) Take by mouth daily.   Yes [provider]  levothyroxine (SYNTHROID) 75 MCG tablet Take 75 mcg by mouth daily before breakfast.   Yes [provider]  loperamide (IMODIUM) 2 MG capsule Take by mouth as needed for diarrhea or loose stools.   Yes [provider]  Magnesium 250 MG TABS Take by mouth daily.   Yes [provider]  Menthol (HALLS COUGH DROPS MT) Use as directed in the mouth or throat as needed.   Yes [provider]  Misc Natural Products (BRAINSTRONG MEMORY SUPPORT PO) Take 400 mg by mouth daily.   Yes [provider]  Multiple Vitamins-Minerals (PRESERVISION AREDS 2+MULTI VIT) CAPS Take by mouth daily.   Yes [provider]  TURMERIC PO Take by  mouth daily.   Yes [provider]  VITAMIN A PO Take by mouth daily.   Yes [provider]  vitamin B-12 (CYANOCOBALAMIN) 1000 MCG tablet Take 1,000 mcg by mouth daily.   Yes [provider]    Allergies as of 09/19/2020 - Review Complete 09/25/2019  Allergen Reaction Noted  . Aspirin Other (See Comments) 09/22/2019  . Ciprofloxacin Rash 09/22/2019  . Codeine Nausea And Vomiting 09/22/2019    Family History  Problem Relation Age of Onset  . Lung cancer Mother   . Tuberculosis Mother   . Colon cancer Father   . Testicular cancer Son   . Breast cancer Neg Hx     Social History   Socioeconomic History  . Marital status: Married    Spouse name: Not on file  . Number of children: Not on file  . Years of education: Not on file  . Highest education level: Not on file  Occupational History  . Not on file  Tobacco Use  . Smoking status: Never Smoker  . Smokeless tobacco: Never Used  Vaping Use  . Vaping Use: Never used  Substance and Sexual Activity  . Alcohol use: Yes  . Drug use: Never  . Sexual activity: Not on file  Other Topics Concern  . Not on file  Social History Narrative  . Not on file   Social Determinants of Health   Financial Resource Strain: Not on file  Food Insecurity: Not on file  Transportation Needs: Not on file  Physical Activity: Not on file  Stress: Not on file  Social Connections: Not on file  Intimate Partner Violence: Not on file    Review of Systems: See HPI, otherwise negative ROS  Physical Exam: BP (!) 176/80   Pulse 78   Temp 97.6 F (36.4 C)   Ht 5\' 4"  (1.626 m)   Wt 59 kg   SpO2 100%   BMI 22.31 kg/m  General:   Alert,  pleasant and cooperative in NAD Head:  Normocephalic and atraumatic. Respiratory:  Normal work of breathing. Cardiovascular:  RRR  Impression/Plan: is here for cataract surgery.  Risks, benefits, limitations, and alternatives regarding cataract surgery have  been reviewed with the patient.  Questions have been answered.  All parties agreeable.   [name], MD  10/28/2020, 11:51 AM

## 2020-10-28 NOTE — Transfer of Care (Signed)
Immediate Anesthesia Transfer of Care Note  Patient: Sarah Paul  Procedure(s) Performed: CATARACT EXTRACTION PHACO AND INTRAOCULAR LENS PLACEMENT (IOC) RIGHT (Right Eye)  Patient Location: PACU  Anesthesia Type: MAC  Level of Consciousness: awake, alert  and patient cooperative  Airway and Oxygen Therapy: Patient Spontanous Breathing and Patient connected to supplemental oxygen  Post-op Assessment: Post-op Vital signs reviewed, Patient's Cardiovascular Status Stable, Respiratory Function Stable, Patent Airway and No signs of Nausea or vomiting  Post-op Vital Signs: Reviewed and stable  Complications: No complications documented.

## 2020-10-28 NOTE — Anesthesia Postprocedure Evaluation (Signed)
Anesthesia Post Note  Patient: Sarah Paul  Procedure(s) Performed: CATARACT EXTRACTION PHACO AND INTRAOCULAR LENS PLACEMENT (IOC) RIGHT (Right Eye)     Patient location during evaluation: PACU Anesthesia Type: MAC Level of consciousness: awake and alert Pain management: pain level controlled Vital Signs Assessment: post-procedure vital signs reviewed and stable Respiratory status: spontaneous breathing, nonlabored ventilation, respiratory function stable and patient connected to nasal cannula oxygen Cardiovascular status: stable and blood pressure returned to baseline Postop Assessment: no apparent nausea or vomiting Anesthetic complications: no   No complications documented.  Fidel Levy

## 2020-10-28 NOTE — Op Note (Signed)
OPERATIVE NOTE  Danaria Larsen Toto [id number] 10/28/2020   PREOPERATIVE DIAGNOSIS:  Nuclear sclerotic cataract right eye.  H25.11   POSTOPERATIVE DIAGNOSIS:    Nuclear sclerotic cataract right eye.     PROCEDURE:  Phacoemusification with posterior chamber intraocular lens placement of the right eye   LENS:   Implant Name Type Inv. Item Serial No. Manufacturer Lot No. LRB No. Used Action  LENS IOL TECNIS EYHANCE 30.5 - [id number] Intraocular Lens LENS IOL TECNIS EYHANCE 30.5 [id number] JOHNSON   Right 1 Implanted       Procedure(s) with comments: CATARACT EXTRACTION PHACO AND INTRAOCULAR LENS PLACEMENT (IOC) RIGHT (Right) - 9.61 00:59.9  DIB00 +30.5   ULTRASOUND TIME: 0 minutes 59.9 seconds.  CDE 9.61   SURGEON:  Willey Blade, MD, MPH  ANESTHESIOLOGIST: Anesthesiologist: Orrin Brigham, MD CRNA: Maree Krabbe, CRNA   ANESTHESIA:  Topical with tetracaine drops augmented with 1% preservative-free intracameral lidocaine.  ESTIMATED BLOOD LOSS: less than 1 mL.   COMPLICATIONS:  None.   DESCRIPTION OF PROCEDURE:  The patient was identified in the holding room and transported to the operating room and placed in the supine position under the operating microscope.  The right eye was identified as the operative eye and it was prepped and draped in the usual sterile ophthalmic fashion.   A 1.0 millimeter clear-corneal paracentesis was made at the 10:30 position. 0.5 ml of preservative-free 1% lidocaine with epinephrine was injected into the anterior chamber.  The anterior chamber was filled with Healon 5 viscoelastic.  A 2.4 millimeter keratome was used to make a near-clear corneal incision at the 8:00 position.  A curvilinear capsulorrhexis was made with a cystotome and capsulorrhexis forceps.  Balanced salt solution was used to hydrodissect and hydrodelineate the nucleus.   Phacoemulsification was then used in stop and chop fashion to remove the lens nucleus and epinucleus.  The  remaining cortex was then removed using the irrigation and aspiration handpiece. Healon was then placed into the capsular bag to distend it for lens placement.  A lens was then injected into the capsular bag.  The remaining viscoelastic was aspirated.   Wounds were hydrated with balanced salt solution.  The anterior chamber was inflated to a physiologic pressure with balanced salt solution.   Intracameral vigamox 0.1 mL undiluted was injected into the eye and a drop placed onto the ocular surface.  No wound leaks were noted.  The patient was taken to the recovery room in stable condition without complications of anesthesia or surgery  Willey Blade 10/28/2020, 12:21 PM

## 2020-10-28 NOTE — Anesthesia Procedure Notes (Signed)
Procedure Name: MAC Date/Time: 10/28/2020 12:07 PM Performed by: Cameron Ali, CRNA Pre-anesthesia Checklist: Patient identified, Emergency Drugs available, Suction available, Timeout performed and Patient being monitored Patient Re-evaluated:Patient Re-evaluated prior to induction Oxygen Delivery Method: Nasal cannula Placement Confirmation: positive ETCO2

## 2020-10-28 NOTE — Anesthesia Preprocedure Evaluation (Signed)
Anesthesia Evaluation  Patient identified by MRN, date of birth, ID band Patient awake    Reviewed: NPO status   History of Anesthesia Complications Negative for: history of anesthetic complications  Airway Mallampati: II  TM Distance: >3 FB Neck ROM: full    Dental  (+) Lower Dentures   Pulmonary neg pulmonary ROS,    Pulmonary exam normal        Cardiovascular Exercise Tolerance: Good hypertension (white coat : no meds), Normal cardiovascular exam     Neuro/Psych negative neurological ROS  negative psych ROS   GI/Hepatic Neg liver ROS, GERD  Controlled,  Endo/Other  Hypothyroidism   Renal/GU negative Renal ROS  negative genitourinary   Musculoskeletal  (+) Arthritis ,   Abdominal   Peds  Hematology negative hematology ROS (+)   Anesthesia Other Findings  pcp: Gauger, Victoriano Lain, NP at 04/29/2020 ;  Shaky voice d/t allergies.  Reproductive/Obstetrics                             Anesthesia Physical Anesthesia Plan  ASA: II  Anesthesia Plan: MAC   Post-op Pain Management:    Induction:   PONV Risk Score and Plan: 2 and Midazolam and TIVA  Airway Management Planned:   Additional Equipment:   Intra-op Plan:   Post-operative Plan:   Informed Consent: I have reviewed the patients History and Physical, chart, labs and discussed the procedure including the risks, benefits and alternatives for the proposed anesthesia with the patient or authorized representative who has indicated his/her understanding and acceptance.       Plan Discussed with: CRNA  Anesthesia Plan Comments:         Anesthesia Quick Evaluation

## 2020-10-29 ENCOUNTER — Encounter: Payer: Self-pay | Admitting: Ophthalmology

## 2020-10-31 ENCOUNTER — Other Ambulatory Visit: Payer: Self-pay

## 2020-10-31 ENCOUNTER — Encounter: Payer: Self-pay | Admitting: Ophthalmology

## 2020-11-07 NOTE — Discharge Instructions (Signed)

## 2020-11-11 ENCOUNTER — Other Ambulatory Visit: Payer: Self-pay

## 2020-11-11 ENCOUNTER — Ambulatory Visit: Payer: Medicare Other | Admitting: Anesthesiology

## 2020-11-11 ENCOUNTER — Encounter: Payer: Self-pay | Admitting: Ophthalmology

## 2020-11-11 ENCOUNTER — Ambulatory Visit
Admission: RE | Admit: 2020-11-11 | Discharge: 2020-11-11 | Disposition: A | Discharge status: Home / Self Care | Payer: Medicare Other | Attending: Ophthalmology | Admitting: Ophthalmology

## 2020-11-11 ENCOUNTER — Encounter
Admission: RE | Disposition: A | Discharge status: Home / Self Care | Payer: Self-pay | Source: Home / Self Care | Attending: Ophthalmology

## 2020-11-11 DIAGNOSIS — Z886 Allergy status to analgesic agent status: Secondary | ICD-10-CM | POA: Insufficient documentation

## 2020-11-11 DIAGNOSIS — Z881 Allergy status to other antibiotic agents status: Secondary | ICD-10-CM | POA: Diagnosis not present

## 2020-11-11 DIAGNOSIS — Z79890 Hormone replacement therapy (postmenopausal): Secondary | ICD-10-CM | POA: Diagnosis not present

## 2020-11-11 DIAGNOSIS — Z8719 Personal history of other diseases of the digestive system: Secondary | ICD-10-CM | POA: Insufficient documentation

## 2020-11-11 DIAGNOSIS — Z885 Allergy status to narcotic agent status: Secondary | ICD-10-CM | POA: Diagnosis not present

## 2020-11-11 DIAGNOSIS — H2512 Age-related nuclear cataract, left eye: Secondary | ICD-10-CM | POA: Diagnosis present

## 2020-11-11 DIAGNOSIS — I10 Essential (primary) hypertension: Secondary | ICD-10-CM | POA: Insufficient documentation

## 2020-11-11 DIAGNOSIS — Z79899 Other long term (current) drug therapy: Secondary | ICD-10-CM | POA: Insufficient documentation

## 2020-11-11 DIAGNOSIS — E785 Hyperlipidemia, unspecified: Secondary | ICD-10-CM | POA: Insufficient documentation

## 2020-11-11 DIAGNOSIS — E039 Hypothyroidism, unspecified: Secondary | ICD-10-CM | POA: Diagnosis not present

## 2020-11-11 HISTORY — PX: CATARACT EXTRACTION W/PHACO: SHX586

## 2020-11-11 SURGERY — PHACOEMULSIFICATION, CATARACT, WITH IOL INSERTION
Anesthesia: Monitor Anesthesia Care | Site: Eye | Laterality: Left

## 2020-11-11 MED ORDER — ACETAMINOPHEN 325 MG PO TABS: 325.0000 mg | ORAL_TABLET | ORAL | Status: DC | PRN

## 2020-11-11 MED ORDER — EPINEPHRINE PF 1 MG/ML IJ SOLN
INTRAOCULAR | Status: DC | PRN
  Administered 2020-11-11: 81 mL via OPHTHALMIC

## 2020-11-11 MED ORDER — ONDANSETRON HCL 4 MG/2ML IJ SOLN: 4.0000 mg | Freq: Once | INTRAMUSCULAR | Status: DC | PRN

## 2020-11-11 MED ORDER — LIDOCAINE HCL (PF) 2 % IJ SOLN
INTRAOCULAR | Status: DC | PRN
  Administered 2020-11-11: 1 mL via INTRAOCULAR

## 2020-11-11 MED ORDER — SODIUM HYALURONATE 10 MG/ML IO SOLUTION
PREFILLED_SYRINGE | INTRAOCULAR | Status: DC | PRN
  Administered 2020-11-11: 0.55 mL via INTRAOCULAR

## 2020-11-11 MED ORDER — TETRACAINE HCL 0.5 % OP SOLN
1.0000 [drp] | OPHTHALMIC | Status: DC | PRN
  Administered 2020-11-11 (×3): 1 [drp] via OPHTHALMIC

## 2020-11-11 MED ORDER — ARMC OPHTHALMIC DILATING DROPS
1.0000 "application " | OPHTHALMIC | Status: DC | PRN
  Administered 2020-11-11 (×3): 1 via OPHTHALMIC

## 2020-11-11 MED ORDER — ACETAMINOPHEN 160 MG/5ML PO SOLN: 325.0000 mg | ORAL | Status: DC | PRN

## 2020-11-11 MED ORDER — SODIUM HYALURONATE 23MG/ML IO SOSY
PREFILLED_SYRINGE | INTRAOCULAR | Status: DC | PRN
  Administered 2020-11-11: 0.6 mL via INTRAOCULAR

## 2020-11-11 MED ORDER — FENTANYL CITRATE (PF) 100 MCG/2ML IJ SOLN
INTRAMUSCULAR | Status: DC | PRN
  Administered 2020-11-11: 50 ug via INTRAVENOUS

## 2020-11-11 MED ORDER — MOXIFLOXACIN HCL 0.5 % OP SOLN
OPHTHALMIC | Status: DC | PRN
  Administered 2020-11-11: 0.2 mL via OPHTHALMIC

## 2020-11-11 MED ORDER — MIDAZOLAM HCL 2 MG/2ML IJ SOLN
INTRAMUSCULAR | Status: DC | PRN
  Administered 2020-11-11: 2 mg via INTRAVENOUS

## 2020-11-11 SURGICAL SUPPLY — 17 items
CANNULA ANT/CHMB 27GA (MISCELLANEOUS) ×4 IMPLANT
DISSECTOR HYDRO NUCLEUS 50X22 (MISCELLANEOUS) ×2 IMPLANT
GLOVE PI ULTRA LF STRL 7.5 (GLOVE) ×2 IMPLANT
GLOVE PI ULTRA NON LATEX 7.5 (GLOVE) ×2
GLOVE SURG SYN 8.5  E (GLOVE) ×1
GLOVE SURG SYN 8.5 E (GLOVE) ×1 IMPLANT
GOWN STRL REUS W/ TWL LRG LVL3 (GOWN DISPOSABLE) ×2 IMPLANT
GOWN STRL REUS W/TWL LRG LVL3 (GOWN DISPOSABLE) ×4
LENS IOL TECNIS EYHANCE 30.0 (Intraocular Lens) ×2 IMPLANT
MARKER SKIN DUAL TIP RULER LAB (MISCELLANEOUS) ×2 IMPLANT
PACK DR. KING ARMS (PACKS) ×2 IMPLANT
PACK EYE AFTER SURG (MISCELLANEOUS) ×2 IMPLANT
PACK OPTHALMIC (MISCELLANEOUS) ×2 IMPLANT
SYR 3ML LL SCALE MARK (SYRINGE) ×2 IMPLANT
SYR TB 1ML LUER SLIP (SYRINGE) ×2 IMPLANT
WATER STERILE IRR 250ML POUR (IV SOLUTION) ×2 IMPLANT
WIPE NON LINTING 3.25X3.25 (MISCELLANEOUS) ×2 IMPLANT

## 2020-11-11 NOTE — Anesthesia Preprocedure Evaluation (Signed)
Anesthesia Evaluation  Patient identified by MRN, date of birth, ID band Patient awake    Reviewed: Allergy & Precautions, NPO status   History of Anesthesia Complications Negative for: history of anesthetic complications  Airway Mallampati: II  TM Distance: >3 FB Neck ROM: full    Dental  (+) Lower Dentures   Pulmonary neg pulmonary ROS,    Pulmonary exam normal        Cardiovascular Exercise Tolerance: Good hypertension, Normal cardiovascular exam     Neuro/Psych    GI/Hepatic GERD  Controlled,  Endo/Other  Hypothyroidism   Renal/GU      Musculoskeletal  (+) Arthritis ,   Abdominal   Peds  Hematology   Anesthesia Other Findings  pcp: [place] Martes Victoriano Lain, NP at 04/29/2020 ;  Shaky voice d/t allergies.  Reproductive/Obstetrics                             Anesthesia Physical  Anesthesia Plan  ASA: II  Anesthesia Plan: MAC   Post-op Pain Management:    Induction:   PONV Risk Score and Plan: 2 and Midazolam and TIVA  Airway Management Planned: Nasal Cannula and Natural Airway  Additional Equipment:   Intra-op Plan:   Post-operative Plan:   Informed Consent: I have reviewed the patients History and Physical, chart, labs and discussed the procedure including the risks, benefits and alternatives for the proposed anesthesia with the patient or authorized representative who has indicated his/her understanding and acceptance.       Plan Discussed with: CRNA  Anesthesia Plan Comments:         Anesthesia Quick Evaluation

## 2020-11-11 NOTE — H&P (Signed)
[facility]   Primary Care Physician:  Myrene Buddy, NP Ophthalmologist: Dr. Willey Blade  Pre-Procedure History & Physical: HPI:  Sarah Paul is a 80 y.o. female here for cataract surgery.   Past Medical History:  Diagnosis Date  . Diverticulosis   . GERD (gastroesophageal reflux disease)   . Hyperlipidemia   . Hypertension   . Hypothyroidism   . Macular degeneration   . Wears dentures    partial lower    Past Surgical History:  Procedure Laterality Date  . BREAST BIOPSY Left 09/29/2009   [place] bx-neg  . CATARACT EXTRACTION W/PHACO Right 10/28/2020   Procedure: CATARACT EXTRACTION PHACO AND INTRAOCULAR LENS PLACEMENT (IOC) RIGHT;  Surgeon: Nevada Crane, MD;  Location: [facility];  Service: Ophthalmology;  Laterality: Right;  9.61 00:59.9  . COLONOSCOPY    . COLONOSCOPY WITH PROPOFOL N/A 09/25/2019   Procedure: COLONOSCOPY WITH PROPOFOL;  Surgeon: Toledo, Boykin Nearing, MD;  Location: [facility] END[facility];  Service: Gastroenterology;  Laterality: N/A;  . ESOPHAGOGASTRODUODENOSCOPY      Prior to Admission medications   Medication Sig Start Date End Date Taking? Authorizing Provider  acetaminophen (TYLENOL) 500 MG tablet Take 500 mg by mouth every 6 (six) hours as needed.   Yes [provider]  Biotin 1 MG CAPS Take 1 tablet by mouth daily.   Yes [provider]  CHOLECALCIFEROL PO Take 1,000 Units by mouth daily.   Yes [provider]  Coenzyme Q10 (COQ-10 PO) Take by mouth daily.   Yes [provider]  levothyroxine (SYNTHROID) 75 MCG tablet Take 75 mcg by mouth daily before breakfast.   Yes [provider]  loperamide (IMODIUM) 2 MG capsule Take by mouth as needed for diarrhea or loose stools.   Yes [provider]  Magnesium 250 MG TABS Take by mouth daily.   Yes [provider]  Menthol (HALLS COUGH DROPS MT) Use as directed in the mouth or throat as needed.   Yes [provider]  Misc Natural Products (BRAINSTRONG MEMORY SUPPORT PO) Take 400 mg by mouth daily.   Yes [provider]  Multiple Vitamins-Minerals (PRESERVISION AREDS 2+MULTI VIT) CAPS Take by mouth daily.   Yes [provider]  TURMERIC PO Take by mouth daily.   Yes [provider]  VITAMIN A PO Take by mouth daily.   Yes [provider]  vitamin B-12 (CYANOCOBALAMIN) 1000 MCG tablet Take 1,000 mcg by mouth daily.   Yes [provider]    Allergies as of 09/19/2020 - Review Complete 09/25/2019  Allergen Reaction Noted  . Aspirin Other (See Comments) 09/22/2019  . Ciprofloxacin Rash 09/22/2019  . Codeine Nausea And Vomiting 09/22/2019    Family History  Problem Relation Age of Onset  . Lung cancer Mother   . Tuberculosis Mother   . Colon cancer Father   . Testicular cancer Son   . Breast cancer Neg Hx     Social History   Socioeconomic History  . Marital status: Married    Spouse name: Not on file  . Number of children: Not on file  . Years of education: Not on file  . Highest education level: Not on file  Occupational History  . Not on file  Tobacco Use  . Smoking status: Never Smoker  . Smokeless tobacco: Never Used  Vaping Use  . Vaping Use: Never used  Substance and Sexual Activity  . Alcohol use: Yes  . Drug use: Never  .  Sexual activity: Not on file  Other Topics Concern  . Not on file  Social History Narrative  . Not on file   Social Determinants of Health   Financial Resource Strain: Not on file  Food Insecurity: Not on file  Transportation Needs: Not on file  Physical Activity: Not on file  Stress: Not on file  Social Connections: Not on file  Intimate Partner Violence: Not on file    Review of Systems: See HPI, otherwise negative ROS  Physical Exam: BP (!) 165/90   Pulse (!) 56   Temp (!) 97.2 F (36.2 C) (Temporal)   Resp 16   Ht 5\' 4"  (1.626 m)   Wt 57.6 kg   SpO2 97%   BMI 21.80 kg/m  General:    Alert,  pleasant and cooperative in NAD Head:  Normocephalic and atraumatic. Respiratory:  Normal work of breathing. Cardiovascular:  RRR  Impression/Plan: is here for cataract surgery.  Risks, benefits, limitations, and alternatives regarding cataract surgery have been reviewed with the patient.  Questions have been answered.  All parties agreeable.   [name], MD  11/11/2020, 8:01 AM

## 2020-11-11 NOTE — Anesthesia Postprocedure Evaluation (Signed)
Anesthesia Post Note  Patient: Sarah Paul  Procedure(s) Performed: CATARACT EXTRACTION PHACO AND INTRAOCULAR LENS PLACEMENT (IOC) LEFT (Left Eye)     Patient location during evaluation: PACU Anesthesia Type: MAC Level of consciousness: awake Pain management: pain level controlled Vital Signs Assessment: post-procedure vital signs reviewed and stable Respiratory status: respiratory function stable Cardiovascular status: stable Postop Assessment: no apparent nausea or vomiting Anesthetic complications: no   No complications documented.  Veda Canning

## 2020-11-11 NOTE — Op Note (Signed)
OPERATIVE NOTE  Sarah Paul [id number] 11/11/2020   PREOPERATIVE DIAGNOSIS:  Nuclear sclerotic cataract left eye.  H25.12   POSTOPERATIVE DIAGNOSIS:    Nuclear sclerotic cataract left eye.     PROCEDURE:  Phacoemusification with posterior chamber intraocular lens placement of the left eye   LENS:   Implant Name Type Inv. Item Serial No. Manufacturer Lot No. LRB No. Used Action  LENS IOL TECNIS EYHANCE 30.0 - [id number] Intraocular Lens LENS IOL TECNIS EYHANCE 30.0 [id number] [id number]   Left 1 Implanted      Procedure(s) with comments: CATARACT EXTRACTION PHACO AND INTRAOCULAR LENS PLACEMENT (IOC) LEFT (Left) - 6.73 0:40.0  DIB00 +30.0   ULTRASOUND TIME: 0 minutes 40 seconds.  CDE 6.73   SURGEON:  Willey Blade, MD, MPH   ANESTHESIA:  Topical with tetracaine drops augmented with 1% preservative-free intracameral lidocaine.  ESTIMATED BLOOD LOSS: <1 mL   COMPLICATIONS:  None.   DESCRIPTION OF PROCEDURE:  The patient was identified in the holding room and transported to the operating room and placed in the supine position under the operating microscope.  The left eye was identified as the operative eye and it was prepped and draped in the usual sterile ophthalmic fashion.   A 1.0 millimeter clear-corneal paracentesis was made at the 5:00 position. 0.5 ml of preservative-free 1% lidocaine with epinephrine was injected into the anterior chamber.  The anterior chamber was filled with Healon 5 viscoelastic.  A 2.4 millimeter keratome was used to make a near-clear corneal incision at the 2:00 position.  A curvilinear capsulorrhexis was made with a cystotome and capsulorrhexis forceps.  Balanced salt solution was used to hydrodissect and hydrodelineate the nucleus.   Phacoemulsification was then used in stop and chop fashion to remove the lens nucleus and epinucleus.  The remaining cortex was then removed using the irrigation and aspiration handpiece. Healon was then placed into the  capsular bag to distend it for lens placement.  A lens was then injected into the capsular bag.  The remaining viscoelastic was aspirated.   Wounds were hydrated with balanced salt solution.  The anterior chamber was inflated to a physiologic pressure with balanced salt solution.  Intracameral vigamox 0.1 mL undiltued was injected into the eye and a drop placed onto the ocular surface.  No wound leaks were noted.  The patient was taken to the recovery room in stable condition without complications of anesthesia or surgery  Willey Blade 11/11/2020, 8:29 AM

## 2020-11-11 NOTE — Anesthesia Procedure Notes (Signed)
Procedure Name: MAC Date/Time: 11/11/2020 8:09 AM Performed by: Jeannene Patella, CRNA Pre-anesthesia Checklist: Patient identified, Emergency Drugs available, Suction available, Timeout performed and Patient being monitored Patient Re-evaluated:Patient Re-evaluated prior to induction Oxygen Delivery Method: Nasal cannula Placement Confirmation: positive ETCO2

## 2020-11-11 NOTE — Transfer of Care (Signed)
Immediate Anesthesia Transfer of Care Note  Patient: Sarah Paul  Procedure(s) Performed: CATARACT EXTRACTION PHACO AND INTRAOCULAR LENS PLACEMENT (IOC) LEFT (Left Eye)  Patient Location: PACU  Anesthesia Type: MAC  Level of Consciousness: awake, alert  and patient cooperative  Airway and Oxygen Therapy: Patient Spontanous Breathing and Patient connected to supplemental oxygen  Post-op Assessment: Post-op Vital signs reviewed, Patient's Cardiovascular Status Stable, Respiratory Function Stable, Patent Airway and No signs of Nausea or vomiting  Post-op Vital Signs: Reviewed and stable  Complications: No complications documented.

## 2020-11-12 ENCOUNTER — Encounter: Payer: Self-pay | Admitting: Ophthalmology
